# Patient Record
Sex: Male | Born: 1950 | Race: Black or African American | Hispanic: No | Smoking: Former smoker
Health system: Southern US, Community
[De-identification: ages and names within clinical notes are randomized; demographics above are authoritative.]

## PROBLEM LIST (undated history)

## (undated) DIAGNOSIS — K219 Gastro-esophageal reflux disease without esophagitis: Secondary | ICD-10-CM

## (undated) DIAGNOSIS — I10 Essential (primary) hypertension: Secondary | ICD-10-CM

## (undated) DIAGNOSIS — M1990 Unspecified osteoarthritis, unspecified site: Secondary | ICD-10-CM

## (undated) DIAGNOSIS — J449 Chronic obstructive pulmonary disease, unspecified: Secondary | ICD-10-CM

## (undated) HISTORY — PX: NECK SURGERY: SHX720

---

## 2003-09-26 ENCOUNTER — Inpatient Hospital Stay (HOSPITAL_COMMUNITY): Admission: RE | Admit: 2003-09-26 | Discharge: 2003-09-29 | Payer: Self-pay | Admitting: Neurosurgery

## 2005-04-18 ENCOUNTER — Emergency Department: Payer: Self-pay | Admitting: Emergency Medicine

## 2012-12-31 ENCOUNTER — Observation Stay: Payer: Self-pay | Admitting: Student

## 2012-12-31 LAB — COMPREHENSIVE METABOLIC PANEL
Albumin: 3.6 g/dL (ref 3.4–5.0)
Alkaline Phosphatase: 78 U/L (ref 50–136)
Anion Gap: 5 — ABNORMAL LOW (ref 7–16)
BUN: 7 mg/dL (ref 7–18)
Bilirubin,Total: 0.2 mg/dL (ref 0.2–1.0)
Calcium, Total: 8.7 mg/dL (ref 8.5–10.1)
Chloride: 106 mmol/L (ref 98–107)
Co2: 26 mmol/L (ref 21–32)
Creatinine: 1.09 mg/dL (ref 0.60–1.30)
EGFR (African American): >60
EGFR (Non-African Amer.): >60
Glucose: 110 mg/dL — ABNORMAL HIGH (ref 65–99)
Osmolality: 272 (ref 275–301)
Potassium: 3.6 mmol/L (ref 3.5–5.1)
SGOT(AST): 14 U/L — ABNORMAL LOW (ref 15–37)
SGPT (ALT): 15 U/L (ref 12–78)
Sodium: 137 mmol/L (ref 136–145)
Total Protein: 7.9 g/dL (ref 6.4–8.2)

## 2012-12-31 LAB — CBC WITH DIFFERENTIAL/PLATELET
Basophil #: 0.2 10*3/uL — ABNORMAL HIGH (ref 0.0–0.1)
Basophil %: 1.2 %
Eosinophil #: 0.2 10*3/uL (ref 0.0–0.7)
Eosinophil %: 1.5 %
HCT: 40.2 % (ref 40.0–52.0)
HGB: 13.6 g/dL (ref 13.0–18.0)
Lymphocyte #: 2.6 10*3/uL (ref 1.0–3.6)
Lymphocyte %: 19.9 %
MCH: 28.2 pg (ref 26.0–34.0)
MCHC: 33.9 g/dL (ref 32.0–36.0)
MCV: 83 fL (ref 80–100)
Monocyte #: 1 x10 3/mm (ref 0.2–1.0)
Monocyte %: 7.8 %
Neutrophil #: 9.1 10*3/uL — ABNORMAL HIGH (ref 1.4–6.5)
Neutrophil %: 69.6 %
Platelet: 288 10*3/uL (ref 150–440)
RBC: 4.83 10*6/uL (ref 4.40–5.90)
RDW: 16.3 % — ABNORMAL HIGH (ref 11.5–14.5)
WBC: 13.1 10*3/uL — ABNORMAL HIGH (ref 3.8–10.6)

## 2012-12-31 LAB — TROPONIN I
Troponin-I: <0.02 ng/mL
Troponin-I: <0.02 ng/mL

## 2012-12-31 LAB — TSH: Thyroid Stimulating Horm: 1.55 u[IU]/mL

## 2013-01-01 LAB — TROPONIN I: Troponin-I: <0.02 ng/mL

## 2013-01-01 LAB — BASIC METABOLIC PANEL
Anion Gap: 6 — ABNORMAL LOW (ref 7–16)
BUN: 8 mg/dL (ref 7–18)
Calcium, Total: 9 mg/dL (ref 8.5–10.1)
Chloride: 104 mmol/L (ref 98–107)
Co2: 28 mmol/L (ref 21–32)
Creatinine: 1.05 mg/dL (ref 0.60–1.30)
EGFR (African American): >60
EGFR (Non-African Amer.): >60
Glucose: 93 mg/dL (ref 65–99)
Osmolality: 274 (ref 275–301)
Potassium: 4 mmol/L (ref 3.5–5.1)
Sodium: 138 mmol/L (ref 136–145)

## 2013-01-01 LAB — CBC WITH DIFFERENTIAL/PLATELET
Basophil #: 0.1 10*3/uL (ref 0.0–0.1)
Basophil %: 1.1 %
Eosinophil #: 0.2 10*3/uL (ref 0.0–0.7)
Eosinophil %: 1.9 %
HCT: 42.8 % (ref 40.0–52.0)
HGB: 14.4 g/dL (ref 13.0–18.0)
Lymphocyte #: 2.3 10*3/uL (ref 1.0–3.6)
Lymphocyte %: 18 %
MCH: 28.2 pg (ref 26.0–34.0)
MCHC: 33.7 g/dL (ref 32.0–36.0)
MCV: 84 fL (ref 80–100)
Monocyte #: 1.1 x10 3/mm — ABNORMAL HIGH (ref 0.2–1.0)
Monocyte %: 8.2 %
Neutrophil #: 9.1 10*3/uL — ABNORMAL HIGH (ref 1.4–6.5)
Neutrophil %: 70.8 %
Platelet: 313 10*3/uL (ref 150–440)
RBC: 5.12 10*6/uL (ref 4.40–5.90)
RDW: 16.2 % — ABNORMAL HIGH (ref 11.5–14.5)
WBC: 12.9 10*3/uL — ABNORMAL HIGH (ref 3.8–10.6)

## 2013-01-01 LAB — LIPID PANEL
Cholesterol: 115 mg/dL (ref 0–200)
HDL Cholesterol: 23 mg/dL — ABNORMAL LOW (ref 40–60)
Ldl Cholesterol, Calc: 73 mg/dL (ref 0–100)
Triglycerides: 95 mg/dL (ref 0–200)
VLDL Cholesterol, Calc: 19 mg/dL (ref 5–40)

## 2013-01-01 LAB — HEMOGLOBIN A1C: Hemoglobin A1C: 5.5 % (ref 4.2–6.3)

## 2013-07-06 ENCOUNTER — Emergency Department: Payer: Self-pay | Admitting: Emergency Medicine

## 2013-07-06 LAB — COMPREHENSIVE METABOLIC PANEL
Albumin: 4.1 g/dL (ref 3.4–5.0)
Alkaline Phosphatase: 64 U/L (ref 50–136)
Anion Gap: 4 — ABNORMAL LOW (ref 7–16)
BUN: 14 mg/dL (ref 7–18)
Bilirubin,Total: 0.3 mg/dL (ref 0.2–1.0)
Calcium, Total: 9.4 mg/dL (ref 8.5–10.1)
Chloride: 104 mmol/L (ref 98–107)
Co2: 27 mmol/L (ref 21–32)
Creatinine: 1.1 mg/dL (ref 0.60–1.30)
EGFR (African American): >60
EGFR (Non-African Amer.): >60
Glucose: 95 mg/dL (ref 65–99)
Osmolality: 270 (ref 275–301)
Potassium: 3.7 mmol/L (ref 3.5–5.1)
SGOT(AST): 20 U/L (ref 15–37)
SGPT (ALT): 17 U/L (ref 12–78)
Sodium: 135 mmol/L — ABNORMAL LOW (ref 136–145)
Total Protein: 8.1 g/dL (ref 6.4–8.2)

## 2013-07-06 LAB — CBC
HCT: 46.4 % (ref 40.0–52.0)
HGB: 15.9 g/dL (ref 13.0–18.0)
MCH: 28.3 pg (ref 26.0–34.0)
MCHC: 34.3 g/dL (ref 32.0–36.0)
MCV: 83 fL (ref 80–100)
Platelet: 258 10*3/uL (ref 150–440)
RBC: 5.62 10*6/uL (ref 4.40–5.90)
RDW: 16 % — ABNORMAL HIGH (ref 11.5–14.5)
WBC: 11.1 10*3/uL — ABNORMAL HIGH (ref 3.8–10.6)

## 2013-07-06 LAB — URINALYSIS, COMPLETE
Bacteria: NONE SEEN
Bilirubin,UR: NEGATIVE
Blood: NEGATIVE
Glucose,UR: NEGATIVE mg/dL (ref 0–75)
Hyaline Cast: 2
Ketone: NEGATIVE
Leukocyte Esterase: NEGATIVE
Nitrite: NEGATIVE
Ph: 7 (ref 4.5–8.0)
Protein: NEGATIVE
RBC,UR: <1 /HPF (ref 0–5)
Specific Gravity: 1.015 (ref 1.003–1.030)
Squamous Epithelial: <1
WBC UR: <1 /HPF (ref 0–5)

## 2014-05-19 ENCOUNTER — Emergency Department: Payer: Self-pay | Admitting: Emergency Medicine

## 2014-08-24 ENCOUNTER — Emergency Department: Payer: Self-pay | Admitting: Emergency Medicine

## 2014-08-24 LAB — COMPREHENSIVE METABOLIC PANEL
Albumin: 4.2 g/dL (ref 3.4–5.0)
Alkaline Phosphatase: 61 U/L
Anion Gap: 7 (ref 7–16)
BUN: 19 mg/dL — ABNORMAL HIGH (ref 7–18)
Bilirubin,Total: 0.4 mg/dL (ref 0.2–1.0)
Calcium, Total: 8.8 mg/dL (ref 8.5–10.1)
Chloride: 103 mmol/L (ref 98–107)
Co2: 28 mmol/L (ref 21–32)
Creatinine: 1.26 mg/dL (ref 0.60–1.30)
EGFR (African American): >60
EGFR (Non-African Amer.): >60
Glucose: 107 mg/dL — ABNORMAL HIGH (ref 65–99)
Osmolality: 278 (ref 275–301)
Potassium: 3.4 mmol/L — ABNORMAL LOW (ref 3.5–5.1)
SGOT(AST): 23 U/L (ref 15–37)
SGPT (ALT): 18 U/L
Sodium: 138 mmol/L (ref 136–145)
Total Protein: 8.3 g/dL — ABNORMAL HIGH (ref 6.4–8.2)

## 2014-08-24 LAB — CBC WITH DIFFERENTIAL/PLATELET
Basophil #: 0.1 10*3/uL (ref 0.0–0.1)
Basophil %: 1.3 %
Eosinophil #: 0.1 10*3/uL (ref 0.0–0.7)
Eosinophil %: 0.9 %
HCT: 46.6 % (ref 40.0–52.0)
HGB: 15.2 g/dL (ref 13.0–18.0)
Lymphocyte #: 2.3 10*3/uL (ref 1.0–3.6)
Lymphocyte %: 21.4 %
MCH: 26.7 pg (ref 26.0–34.0)
MCHC: 32.6 g/dL (ref 32.0–36.0)
MCV: 82 fL (ref 80–100)
Monocyte #: 0.7 x10 3/mm (ref 0.2–1.0)
Monocyte %: 6.7 %
Neutrophil #: 7.5 10*3/uL — ABNORMAL HIGH (ref 1.4–6.5)
Neutrophil %: 69.7 %
Platelet: 293 10*3/uL (ref 150–440)
RBC: 5.68 10*6/uL (ref 4.40–5.90)
RDW: 16.7 % — ABNORMAL HIGH (ref 11.5–14.5)
WBC: 10.7 10*3/uL — ABNORMAL HIGH (ref 3.8–10.6)

## 2014-08-24 LAB — LIPASE, BLOOD: Lipase: 41 U/L — ABNORMAL LOW (ref 73–393)

## 2014-08-24 LAB — TROPONIN I: Troponin-I: <0.02 ng/mL

## 2015-02-23 NOTE — Discharge Summary (Signed)
PATIENT NAME:  Roger Allen, [name] MR#:  [id number] DATE OF BIRTH:  11-Oct-1951  DATE OF ADMISSION:  12/31/2012 DATE OF DISCHARGE:  01/01/2013  [facility]   CHIEF COMPLAINT: Chest pain.  DISCHARGE DIAGNOSES: 1. Atypical chest pain, likely musculoskeletal in nature.  2. Abnormal x-ray of the chest with possible right-sided infiltrate versus nodule.  3. Hypertension.  4. Gastroesophageal reflux disease.  5. Chronic obstructive pulmonary disease.  6. Erectile dysfunction.  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   DISCHARGE MEDICATIONS:  Albuterol CFC free 90 mcg, 2 puffs every 4 hours as needed for shortness of breath, omeprazole 20 mg daily, amlodipine 10 mg daily, Tylenol with codeine #3, 1 to 2 tabs every 4 hours as needed for pain, cyclobenzaprine 10 mg 3 times a day as needed for muscle spasm, gabapentin 400 mg 2 caps 3 times a day, sildenafil 50 mg 1/2 tab as needed, aspirin 81 mg daily.   DIET: Low sodium.   ACTIVITY: As tolerated.   [facility]: [facility] Please check further imaging of your lung with your primary care physician for evaluation of the nodule versus early infiltrate. Check CBC within 1 to 2 weeks.   DISPOSITION: Home.   HISTORY OF PRESENT ILLNESS AND HOSPITAL COURSE: For full details of history and physical, please see the dictation on the 28th of February by Dr. Mordecai MaesSanchez;  but briefly, this is a 64 year old male with GERD, COPD, erectile dysfunction and DJD who presented for acute onset chest pain, 10 out of 10, which was located on the right side, radiated all across the lower rib cage. He was admitted to the hospitalist service to rule out acute coronary syndrome. There is no diaphoresis or shortness of breath. He was checked for cyclic troponins which came back negative x 3. He had no elevation of D-dimer as it was 0.43. His TSH was 1.55. He did have slight leukocytosis on admission.  Although x-ray of the chest showed the above finding, pneumonia was felt to be less of a consideration at this point as the patient now a productive cough, shortness of breath or fever. He underwent a stress test which, [facility], was negative for ischemia; however, the final result is pending. There was verbal communication between the cardiologist and the staff. At this point, he will be discharged with outpatient follow-up described above.   TOTAL TIME SPENT:  35 minutes.   CODE STATUS:  The patient is FULL CODE.     ____________________________ Krystal EatonShayiq Talyia Allende, MD sa:cb D: 01/02/2013 08:00:05 ET T: 01/02/2013 14:39:05 ET JOB#: [id number]  cc: Krystal EatonShayiq Burke Terry, MD, <Dictator> Krystal EatonSHAYIQ Rian Busche MD ELECTRONICALLY SIGNED 01/10/2013 13:18

## 2015-02-23 NOTE — H&P (Signed)
PATIENT NAME:  Roger Allen, Roger Allen MR#:  [id number] DATE OF BIRTH:  09/25/1951  DATE OF ADMISSION:  12/31/2012  REASON FOR ADMISSION: Chest pain, atypical.   [facility]   REFERRING PHYSICIAN: Dorothea Glassman, MD.  HISTORY OF PRESENT ILLNESS: The patient is a very nice 64 year old gentleman who has a history of hypertension, GERD, COPD, erectile dysfunction and DJD with neck problems for what he is disabled. The patient states that overnight he was sitting in the house and around 11:00 p.m. or 12 midnight, he was sweeping. After he finished sweeping, he had a severe onset acutely of chest pain, which was 10/10. The pain was located on the right side and radiated all across his lower rib cage. The pain did not go away, but eased down after he sat down and started taking deep breaths and resting, and overnight he was able to go to sleep but the pain was still there in the morning. For that reason, he came to the ER to be evaluated.   The patient had significant pain that was sharp. He described it as "something sitting there." The patient said that he did not have any shortness of breath, diaphoresis, and this pain has never happened before. The pain did not radiate to the arms, to the neck or to the back. It just stayed on the right side, radiating towards the left side. The patient has a history of COPD and he has had severe events with shortness of breath, but this was not the case.   Here at the ER, the patient was evaluated by Dr. Darnelle Catalan, who saw him and did an EKG. The EKG in my opinion did not have much abnormalities but some artifacts on the baseline, but Dr. Darnelle Catalan interpreted it as flattening of T waves on the inferior leads and after he repeated the EKG after giving some aspirin, the EKG looked a little bit better as far as the T waves go. The patient was very upset and very scared about the pain, for what he preferred to stay over here and be evaluated. The patient is  admitted for evaluation of the chest pain.   REVIEW OF SYSTEMS: CONSTITUTIONAL: No fever. No fatigue. No weakness. No weight loss or weight gain.  EYES: No blurry vision, double vision, glaucoma or cataracts.  ENT: No tinnitus. No ear pain. No hearing loss. No seasonal allergies. No difficulty swallowing.  RESPIRATORY: The patient has a history of COPD, but no recent exacerbations. No cough. No wheezing. No hemoptysis. No dyspnea. No painful respirations.  CARDIOVASCULAR: No orthopnea, edema, arrhythmias or syncope in the past. No palpitations, now just the chest pain as described above.  GASTROINTESTINAL: No nausea, vomiting, abdominal pain, constipation or diarrhea.  GENITOURINARY: No dysuria, hematuria, frequency or incontinence. The patient has erectile dysfunction and he takes sildenafil. He has not taken sildenafil in over a week.  HEMATOLOGIC AND LYMPHATIC: No anemia, easy bruising or bleeding.  ENDOCRINE: No polyuria, polydipsia or polyphagia.  MUSCULOSKELETAL: The patient has significant problems with his neck and upper back, for what he is disabled. He had a laminectomy and fusion.  SKIN: No rashes or new lesions.  NEUROLOGIC: No numbness, tingling. No CVAs. No TIAs. No ataxia.  PSYCHIATRIC: No significant nervousness, anxiety or depression.   PAST MEDICAL HISTORY:  1.  Hypertension.  2.  GERD.  3.  COPD.  4.  Erectile dysfunction.  5.  DJD of the cervical spine.   ALLERGIES: No known drug allergies.  PAST SURGICAL HISTORY: Laminectomy/fusion.   FAMILY HISTORY: No history of MI or any cardiac disease. No diabetes. No cancer. His father died from natural death and his mother died from complications of alcoholism.   SOCIAL HISTORY: He used to be a heavy smoker. He smoked 1 pack a day for over 30 years. He quit about 8 years ago when he was diagnosed with COPD. He does not use any drugs. He does not drink. He is disabled due to his neck problems.   CURRENT MEDICATIONS: Include  Tylenol No. 3, sildenafil 50 mg as needed (last time he took it was over 7 days ago, omeprazole 20 mg once a day, ibuprofen 800 mg 3 times a day as needed for pain, gabapentin 800 mg 3 times a day, cyclobenzaprine 10 mg 3 times a day, amlodipine 10 mg once daily, albuterol inhaler as needed for shortness of breath.   PHYSICAL EXAMINATION:  VITAL SIGNS: Blood pressure 144/90, pulse around 110, temperature 97.8, respirations around 20, pulse oximetry of 97% on room air.  GENERAL: The patient is alert, oriented x 3, in no acute distress. No respiratory distress. He is thin. He is very tall. He is well dressed and well nourished.  HEENT: Pupils are equal and reactive. Extraocular movements are intact. Mucosae are moist. Anicteric sclerae. Pink conjunctivae. No oral lesions. No oropharyngeal exudates.  NECK: Supple. No JVD. No thyromegaly. No adenopathy. No carotid bruits. Decreased range of motion due to previous laminectomy/neck fusion.  CARDIOVASCULAR: Regular rate and rhythm. No murmurs, rubs or gallops are appreciated at this moment. The patient has reproducible pain to deep pressure. Whenever he is taking a deep breath and my hand is holding his rib cage, he has reproducible pain but mostly on the right side. The patient states that he still has some left-sided pain that was radiation from the right-sided pain that it is not necessarily fully reproducible. No displacement of PMI.  LUNGS: Clear without any wheezing or crepitus. No significant use of accessory muscles. No dullness to percussion.  ABDOMEN: Soft, nontender, nondistended. No hepatosplenomegaly. No masses. Bowel sounds are positive.  GENITAL: Deferred.  EXTREMITIES: No edema, no cyanosis, no clubbing.   SKIN: Without any rashes or petechiae.  NEUROLOGIC: Cranial nerves II through XII grossly intact. Strength is equal in 4 extremities, 5/5. Sensation is normal.  MUSCULOSKELETAL: No significant joint abnormalities or joint effusions.   PSYCHIATRIC: Mood is normal without any signs of depression or anxiety.  LYMPHATIC: Negative for lymphadenopathy in neck or supraclavicular areas.   LABORATORY, DIAGNOSTIC AND RADIOLOGICAL DATA: Glucose is 110, creatinine 1.09, sodium 137, potassium 3.6. LFTs with normal limits, slight decrease in AST at 14. Troponin is negative. Thyroid-stimulating hormone is 1.55. White count is slightly elevated at 13.1, hemoglobin 13.6, hematocrit 40.2. EKG as mentioned above. Chest x-ray: Mild right basilar heterogeneous opacity could be secondary to atelectasis versus infection.   ASSESSMENT AND PLAN: The patient is a 64 year old gentleman with history of hypertension, gastroesophageal reflux disease, chronic obstructive pulmonary disease, erectile dysfunction and degenerative joint disease, comes with atypical chest pain.  1  Atypical chest pain: It started on the right side. The patient has maybe the beginning of a lung density versus atelectasis in the right base. He does not have any signs of pneumonia. He has not complained of any cough, fever or shortness of breath. He is saturating above 95% on room air. He is slightly tachycardic, but he is low risk for pulmonary embolism. The patient has really no symptoms  related to this consolidation other than the right chest pain. At this moment, we are going to just admit him for observation, rule out cardiac etiology of the pain, but we are going to repeat the chest x-ray including a lateral view. This could be just related to his chronic obstructive pulmonary disease and related to the pain being musculoskeletal, creating atelectasis in that area of the lung. Again, the pain is likely to be musculoskeletal, although the patient has some risk factors for coronary artery disease, for what we are going to do a stress test in the morning. Troponin is negative. We are going to recheck a troponin x 3 every 8 hours and follow up results of the stress test. The patient is  slightly tachycardic, for what we are going to add on a beta blocker. Metoprolol is the one that I choose at this moment. The patient is going to be on full-dose aspirin. He takes sildenafil, but not on a regular basis and it has been over 7 days since the patient had a dose, for what it is safe to use nitrates in case of chest pain coming back again. For now, he has not received any nitrates here in the Emergency Room.  2.  Abnormal chest x-ray: As mentioned above, we are going to repeat a chest x-ray with lateral views to ensure that this is not a new infiltrate. What we are seeing is likely to be atelectasis due to musculoskeletal pain. The patient is not taking deep breaths due to tenderness in that area.  3.  Hypertension: The patient has been slightly hypertensive over here. He takes amlodipine and he is slightly tachycardic. Amlodipine could create some reflex tachycardia. At this moment since he has been taking this for years, I am going to leave it on, but I am going to add on a beta blocker, which he probably needs to continue if his heart rate continues to be elevated.  4.  Tachycardia: As mentioned above, beta blockers included. Possibilities of differential diagnosis of this: Thyroid disease: The patient has a normal TSH. Pulmonary embolism: Very low risk for pulmonary embolism. He is not having any respiratory issues, although he came with chest pain on the right side. His pain is mostly musculoskeletal. I do not think there is a need to order a CT angio of the chest at this moment. If there is any desaturation of oxygen or if the tachycardia persists, will consider ordering one.  5.  Gastroesophageal reflux disease: Continue proton pump inhibitor. 6.  Chronic obstructive pulmonary disease: Continue as-needed inhalers with albuterol. At this moment, there are no signs of acute exacerbation of the chronic obstructive pulmonary disease. If this infiltrate seems to be related to an infectious  process, we are going to start him on antibiotics, likely ceftriaxone and azithromycin, although again at this moment, it does not seem like it is an infectious process. We are going to follow up on white count. His white count is slightly elevated at 13,000 but again, there is no fever, no signs of pneumonia whatsoever. If white count continues to increase, we will start antibiotics.  7.  Deep venous thrombosis prophylaxis: Heparin.  8.  The patient is a full code.   TIME SPENT: I spent about 45 minutes with this patient.   ____________________________ Felipa Furnace, MD rsg:jm D: 12/31/2012 14:08:18 ET T: 12/31/2012 16:06:24 ET JOB#: [id number]  cc: Felipa Furnace, MD, <Dictator> Shallon Yaklin Juanda Chance MD ELECTRONICALLY SIGNED 01/18/2013 12:49

## 2018-05-28 ENCOUNTER — Emergency Department
Admission: EM | Admit: 2018-05-28 | Discharge: 2018-05-28 | Disposition: A | Discharge status: Home / Self Care | Payer: No Typology Code available for payment source | Attending: Emergency Medicine | Admitting: Emergency Medicine

## 2018-05-28 ENCOUNTER — Emergency Department: Payer: No Typology Code available for payment source

## 2018-05-28 DIAGNOSIS — S0990XA Unspecified injury of head, initial encounter: Secondary | ICD-10-CM | POA: Diagnosis present

## 2018-05-28 DIAGNOSIS — Y92410 Unspecified street and highway as the place of occurrence of the external cause: Secondary | ICD-10-CM | POA: Insufficient documentation

## 2018-05-28 DIAGNOSIS — S161XXA Strain of muscle, fascia and tendon at neck level, initial encounter: Secondary | ICD-10-CM

## 2018-05-28 DIAGNOSIS — Y939 Activity, unspecified: Secondary | ICD-10-CM | POA: Insufficient documentation

## 2018-05-28 DIAGNOSIS — Z87891 Personal history of nicotine dependence: Secondary | ICD-10-CM | POA: Insufficient documentation

## 2018-05-28 DIAGNOSIS — J449 Chronic obstructive pulmonary disease, unspecified: Secondary | ICD-10-CM | POA: Insufficient documentation

## 2018-05-28 DIAGNOSIS — I10 Essential (primary) hypertension: Secondary | ICD-10-CM | POA: Diagnosis not present

## 2018-05-28 DIAGNOSIS — Y999 Unspecified external cause status: Secondary | ICD-10-CM | POA: Diagnosis not present

## 2018-05-28 HISTORY — DX: Unspecified osteoarthritis, unspecified site: M19.90

## 2018-05-28 HISTORY — DX: Chronic obstructive pulmonary disease, unspecified: J44.9

## 2018-05-28 HISTORY — DX: Essential (primary) hypertension: I10

## 2018-05-28 HISTORY — DX: Gastro-esophageal reflux disease without esophagitis: K21.9

## 2018-05-28 MED ORDER — ACETAMINOPHEN 325 MG PO TABS: 650.0000 mg | ORAL_TABLET | Freq: Once | ORAL | Status: DC

## 2018-05-28 NOTE — Discharge Instructions (Addendum)
Take over-the-counter pain medications as prescribed for your discomfort.  If you have severe headache or vomiting, if you have change in vision or neurologic problems such as numbness or weakness, if you have trouble breathing, severe abdominal pain, or increased pain in any bones or other concerns please return to the emergency room.  Follow closely with your primary care doctor
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
## 2018-06-09 ENCOUNTER — Other Ambulatory Visit: Payer: Self-pay

## 2018-06-09 ENCOUNTER — Emergency Department
Admission: EM | Admit: 2018-06-09 | Discharge: 2018-06-09 | Disposition: A | Discharge status: Home / Self Care | Payer: Medicare Other | Attending: Emergency Medicine | Admitting: Emergency Medicine

## 2018-06-09 DIAGNOSIS — I10 Essential (primary) hypertension: Secondary | ICD-10-CM | POA: Insufficient documentation

## 2018-06-09 DIAGNOSIS — J449 Chronic obstructive pulmonary disease, unspecified: Secondary | ICD-10-CM | POA: Diagnosis not present

## 2018-06-09 DIAGNOSIS — Z79899 Other long term (current) drug therapy: Secondary | ICD-10-CM | POA: Diagnosis not present

## 2018-06-09 DIAGNOSIS — M7918 Myalgia, other site: Secondary | ICD-10-CM | POA: Diagnosis not present

## 2018-06-09 DIAGNOSIS — M545 Low back pain: Secondary | ICD-10-CM | POA: Insufficient documentation

## 2018-06-09 DIAGNOSIS — M25521 Pain in right elbow: Secondary | ICD-10-CM | POA: Diagnosis present

## 2018-06-09 MED ORDER — IBUPROFEN 600 MG PO TABS: 600.0000 mg | ORAL_TABLET | Freq: Three times a day (TID) | ORAL | 0 refills | Status: AC | PRN

## 2018-06-09 MED ORDER — CYCLOBENZAPRINE HCL 10 MG PO TABS: 10.0000 mg | ORAL_TABLET | Freq: Three times a day (TID) | ORAL | 0 refills | Status: AC | PRN
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
## 2018-06-23 ENCOUNTER — Encounter: Payer: Self-pay | Admitting: Emergency Medicine

## 2018-06-23 ENCOUNTER — Emergency Department
Admission: EM | Admit: 2018-06-23 | Discharge: 2018-06-23 | Disposition: A | Discharge status: Home / Self Care | Payer: Medicare Other | Attending: Emergency Medicine | Admitting: Emergency Medicine

## 2018-06-23 ENCOUNTER — Emergency Department: Payer: Medicare Other

## 2018-06-23 DIAGNOSIS — J449 Chronic obstructive pulmonary disease, unspecified: Secondary | ICD-10-CM | POA: Insufficient documentation

## 2018-06-23 DIAGNOSIS — Z87891 Personal history of nicotine dependence: Secondary | ICD-10-CM | POA: Insufficient documentation

## 2018-06-23 DIAGNOSIS — M542 Cervicalgia: Secondary | ICD-10-CM

## 2018-06-23 DIAGNOSIS — R51 Headache: Secondary | ICD-10-CM | POA: Insufficient documentation

## 2018-06-23 DIAGNOSIS — I10 Essential (primary) hypertension: Secondary | ICD-10-CM | POA: Diagnosis not present

## 2018-06-23 MED ORDER — PREDNISONE 10 MG (21) PO TBPK: ORAL_TABLET | Freq: Every day | ORAL | 0 refills | Status: AC

## 2018-06-23 MED ORDER — OXYCODONE-ACETAMINOPHEN 5-325 MG PO TABS: 1.0000 | ORAL_TABLET | Freq: Two times a day (BID) | ORAL | 0 refills | Status: AC | PRN

## 2018-06-23 MED ORDER — PREDNISONE 20 MG PO TABS
60.0000 mg | ORAL_TABLET | Freq: Once | ORAL | Status: AC
  Administered 2018-06-23: 60 mg via ORAL
  Filled 2018-06-23: qty 3
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
## 2018-06-23 NOTE — ED Notes (Signed)
Patient transported to CT 
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
## 2020-11-15 ENCOUNTER — Other Ambulatory Visit: Payer: Self-pay

## 2020-11-15 ENCOUNTER — Ambulatory Visit (LOCAL_COMMUNITY_HEALTH_CENTER): Payer: Medicare Other

## 2020-11-15 DIAGNOSIS — Z23 Encounter for immunization: Secondary | ICD-10-CM

## 2020-11-15 NOTE — Progress Notes (Signed)
Here for flu vaccine. Reports COVID booster yesterday. Tolerated hi dose flu vaccine well today. [facility] updated and copy given. Jerel Shepherd, RN

## 2021-09-02 ENCOUNTER — Other Ambulatory Visit: Payer: Self-pay

## 2021-09-02 ENCOUNTER — Ambulatory Visit (LOCAL_COMMUNITY_HEALTH_CENTER): Payer: Medicare Other

## 2021-09-02 DIAGNOSIS — Z23 Encounter for immunization: Secondary | ICD-10-CM | POA: Diagnosis not present

## 2021-09-02 NOTE — Progress Notes (Signed)
In Nurse Clinic for flu vaccine. High Dose flu vaccine given and tolerated well. Updated [facility] copy given. Jerel Shepherd, RN

## 2022-10-01 ENCOUNTER — Ambulatory Visit (LOCAL_COMMUNITY_HEALTH_CENTER): Payer: Medicare Other

## 2022-10-01 DIAGNOSIS — Z23 Encounter for immunization: Secondary | ICD-10-CM

## 2022-10-01 DIAGNOSIS — Z719 Counseling, unspecified: Secondary | ICD-10-CM

## 2022-10-01 NOTE — Progress Notes (Signed)
Client seen for flu vaccine today. Discussed Covid-19 vaccine, client declined today, and plans to schedule an appointment next week for COVID-19.  VIS given.  Vaccine administered and tolerated well.      Rosaly Labarbera Sherrilyn Rist, RN

## 2024-03-27 ENCOUNTER — Other Ambulatory Visit: Payer: Self-pay

## 2024-03-27 ENCOUNTER — Emergency Department

## 2024-03-27 ENCOUNTER — Emergency Department
Admission: EM | Admit: 2024-03-27 | Discharge: 2024-03-27 | Disposition: A | Discharge status: Home / Self Care | Attending: Emergency Medicine | Admitting: Emergency Medicine

## 2024-03-27 DIAGNOSIS — R0789 Other chest pain: Secondary | ICD-10-CM | POA: Diagnosis present

## 2024-03-27 DIAGNOSIS — I209 Angina pectoris, unspecified: Secondary | ICD-10-CM | POA: Insufficient documentation

## 2024-03-27 DIAGNOSIS — I2089 Other forms of angina pectoris: Secondary | ICD-10-CM

## 2024-03-27 LAB — CBC
HCT: 44.5 % (ref 39.0–52.0)
Hemoglobin: 14.6 g/dL (ref 13.0–17.0)
MCH: 25.5 pg — ABNORMAL LOW (ref 26.0–34.0)
MCHC: 32.8 g/dL (ref 30.0–36.0)
MCV: 77.8 fL — ABNORMAL LOW (ref 80.0–100.0)
Platelets: 232 10*3/uL (ref 150–400)
RBC: 5.72 MIL/uL (ref 4.22–5.81)
RDW: 15.9 % — ABNORMAL HIGH (ref 11.5–15.5)
WBC: 9.2 10*3/uL (ref 4.0–10.5)
nRBC: 0 % (ref 0.0–0.2)

## 2024-03-27 LAB — BASIC METABOLIC PANEL WITH GFR
Anion gap: 6 (ref 5–15)
BUN: 18 mg/dL (ref 8–23)
CO2: 26 mmol/L (ref 22–32)
Calcium: 9.3 mg/dL (ref 8.9–10.3)
Chloride: 103 mmol/L (ref 98–111)
Creatinine, Ser: 1.29 mg/dL — ABNORMAL HIGH (ref 0.61–1.24)
GFR, Estimated: 59 mL/min — ABNORMAL LOW (ref >60)
Glucose, Bld: 109 mg/dL — ABNORMAL HIGH (ref 70–99)
Potassium: 3.8 mmol/L (ref 3.5–5.1)
Sodium: 135 mmol/L (ref 135–145)

## 2024-03-27 LAB — TROPONIN I (HIGH SENSITIVITY)
Troponin I (High Sensitivity): 6 ng/L (ref <18)
Troponin I (High Sensitivity): 6 ng/L (ref <18)

## 2024-03-27 MED ORDER — LIDOCAINE 5 % EX PTCH
1.0000 | MEDICATED_PATCH | CUTANEOUS | Status: DC
  Administered 2024-03-27: 1 via TRANSDERMAL
  Filled 2024-03-27: qty 1

## 2024-03-27 MED ORDER — ACETAMINOPHEN 500 MG PO TABS
1000.0000 mg | ORAL_TABLET | Freq: Once | ORAL | Status: AC
  Administered 2024-03-27: 1000 mg via ORAL
  Filled 2024-03-27: qty 2

## 2024-03-27 NOTE — ED Provider Notes (Signed)
.-----------------------------------------   3:06 PM on 03/27/2024 -----------------------------------------  Blood pressure (!) 142/96, pulse 94, temperature 97.7 F (36.5 C), temperature source Oral, resp. rate 18, height 6\' 2"  (1.88 m), weight 78.5 kg, SpO2 95%.  Assuming care from Dr. Felipe Horton.  In short, Roger Allen is a 73 y.o. male with a chief complaint of Chest Pain and Shortness of Breath .  Refer to the original H&P for additional details.  The current plan of care is to repeat trop, likely dc after with cards follow up.  On assessment patient is chest pain-free, asymptomatic at this time.  Discussed with him about cardiology referral as well as follow-up with his primary care doctor.  Shared decision making to patient he is agreeable plan, considered but no indication for inpatient admission at this time, he is safe for outpatient management.  Patient states that he has a colonoscopy scheduled for Tuesday, instructed him to call his primary care doctor's office to see if he needs to be seen to get risk stratified again before proceeding with a colonoscopy.  He is agreeable with the plan.  Will discharge with strict return precautions  Clinical Course as of 03/27/24 1553  Sun Mar 27, 2024  1447 Reassessed.  Pain is resolved.  Feeling well.  Discussed plan of care, troponins and possible outpatient management cardiology referral.  He is agreeable. [DS]  1504 Signed out pending 2nd trop. Cp resolved. Able to dc after with cards follow up. [TT]  1550 Troponin I (High Sensitivity) Second troponin is not elevated. [TT]    Clinical Course User Index [DS] Arline Bennett, MD [TT] Shane Darling, MD      Shane Darling, MD 03/27/24 [phone]

## 2024-03-27 NOTE — ED Triage Notes (Signed)
 Pt here with CP and SOB x1 week. Pt states pain is left sided and has been constant. Pt states pain is tight in nature. Pt denies NVD or headache. Pt ambulatory to triage.

## 2024-03-27 NOTE — ED Provider Notes (Signed)
 [facility] Provider Note    Event Date/Time   First MD Initiated Contact with Patient 03/27/24 1351     (approximate)   History   Chest Pain and Shortness of Breath   HPI  Roger Allen is a 73 y.o. male who presents to the ED for evaluation of Chest Pain and Shortness of Breath   Patient presents for evaluation of about 2 weeks of exertional dyspnea and decreased exercise tolerance, as well is about 1 week of persistent left-sided sharp chest discomfort.  Reports he is a [place] patient but has never seen a cardiologist and has no known cardiac issues.  Previous smoker.  Chronic pain.  Neck fusion.  He reports 2 weeks of difficulty climbing steps, having to take breaks as he gets winded and more tired.  No exertional chest discomfort  Reports 1 week of left-sided sharp chest pain that is worse when he twists or ships his body, worse when he touches it.   Physical Exam   Triage Vital Signs: ED Triage Vitals  Encounter Vitals Group     BP 03/27/24 1341 (!) 142/96     Systolic BP Percentile --      Diastolic BP Percentile --      Pulse Rate 03/27/24 1341 94     Resp 03/27/24 1341 18     Temp 03/27/24 1343 97.7 F (36.5 C)     Temp Source 03/27/24 1343 Oral     SpO2 03/27/24 1341 95 %     Weight 03/27/24 1341 173 lb 1 oz (78.5 kg)     Height 03/27/24 1341 6\' 2"  (1.88 m)     Head Circumference --      Peak Flow --      Pain Score 03/27/24 1341 9     Pain Loc --      Pain Education --      Exclude from Growth Chart --     Most recent vital signs: Vitals:   03/27/24 1341 03/27/24 1343  BP: (!) 142/96   Pulse: 94   Resp: 18   Temp:  97.7 F (36.5 C)  SpO2: 95%     General: Awake, no distress.  CV:  Good peripheral perfusion.  RRR without appreciable murmur Resp:  Normal effort.  No wheezing Abd:  No distention.  MSK:  No deformity noted.  Some reproducible left-sided chest discomfort on palpation without overlying skin changes, rash  or signs of trauma Neuro:  No focal deficits appreciated. Other:     ED Results / Procedures / Treatments   Labs (all labs ordered are listed, but only abnormal results are displayed) Labs Reviewed  BASIC METABOLIC PANEL WITH GFR - Abnormal; Notable for the following components:      Result Value   Glucose, Bld 109 (*)    Creatinine, Ser 1.29 (*)    GFR, Estimated 59 (*)    All other components within normal limits  CBC - Abnormal; Notable for the following components:   MCV 77.8 (*)    MCH 25.5 (*)    RDW 15.9 (*)    All other components within normal limits  TROPONIN I (HIGH SENSITIVITY)    EKG Sinus rhythm with a rate of 85 bpm.  Normal axis and intervals.  No clear signs of acute ischemia  RADIOLOGY CXR interpreted by me without evidence of acute cardiopulmonary pathology.  Official radiology report(s): DG Chest 2 View Result Date: 03/27/2024 CLINICAL DATA:  Chest pain and  shortness of breath. EXAM: CHEST - 2 VIEW COMPARISON:  X-ray 08/24/2014 FINDINGS: Hyperinflation. No consolidation, pneumothorax or effusion. No edema. Normal cardiopericardial silhouette. Chronic lung changes. Mild degenerative changes along the spine. IMPRESSION: Hyperinflation.  No acute cardiopulmonary disease. Electronically Signed   By: Adrianna Horde M.D.   On: 03/27/2024 14:06    PROCEDURES and INTERVENTIONS:  .1-3 Lead EKG Interpretation  Performed by: Arline Bennett, MD Authorized by: Arline Bennett, MD     Interpretation: normal     ECG rate:  90   ECG rate assessment: normal     Rhythm: sinus rhythm     Ectopy: none     Conduction: normal     Medications  lidocaine (LIDODERM) 5 % 1 patch (1 patch Transdermal Patch Applied 03/27/24 1410)  acetaminophen  (TYLENOL ) tablet 1,000 mg (1,000 mg Oral Given 03/27/24 1412)     IMPRESSION / MDM / ASSESSMENT AND PLAN / ED COURSE  I reviewed the triage vital signs and the nursing notes.  Differential diagnosis includes, but is not limited to,  ACS, PTX, PNA, muscle strain/spasm, PE, dissection, anxiety, pleural effusion  {Patient presents with symptoms of an acute illness or injury that is potentially life-threatening.  Patient presents with exertional dyspnea and poor exercise tolerance concerning for stable angina.  No EKG changes and first troponin is negative.  Clear CXR, normal CBC.  Awaiting second troponin at the time of signout to oncoming physician to follow-up with the study.  If negative may be suitable for outpatient management with cardiology referral.  Clinical Course as of 03/27/24 1507  Sun Mar 27, 2024  1447 Reassessed.  Pain is resolved.  Feeling well.  Discussed plan of care, troponins and possible outpatient management cardiology referral.  He is agreeable. [DS]  1504 Signed out pending 2nd trop. Cp resolved. Able to dc after with cards follow up. [TT]    Clinical Course User Index [DS] Arline Bennett, MD [TT] Drenda Gentle Richard Champion, MD     FINAL CLINICAL IMPRESSION(S) / ED DIAGNOSES   Final diagnoses:  Other chest pain  Stable angina (HCC)     Rx / DC Orders   ED Discharge Orders          Ordered    Ambulatory referral to Cardiology       Comments: If you have not heard from the Cardiology office within the next 72 hours please call [phone].   03/27/24 1506             Note:  This document was prepared using Dragon voice recognition software and may include unintentional dictation errors.   Arline Bennett, MD 03/27/24 [phone]
